# Patient Record
Sex: Female | Born: 1950 | Race: White | Hispanic: No | Marital: Married | Smoking: Never smoker
Health system: Southern US, Community
[De-identification: ages and names within clinical notes are randomized; demographics above are authoritative.]

## PROBLEM LIST (undated history)

## (undated) DIAGNOSIS — M25561 Pain in right knee: Secondary | ICD-10-CM

## (undated) DIAGNOSIS — N760 Acute vaginitis: Secondary | ICD-10-CM

## (undated) DIAGNOSIS — F32A Depression, unspecified: Secondary | ICD-10-CM

## (undated) DIAGNOSIS — I6529 Occlusion and stenosis of unspecified carotid artery: Secondary | ICD-10-CM

## (undated) DIAGNOSIS — E7800 Pure hypercholesterolemia, unspecified: Secondary | ICD-10-CM

## (undated) DIAGNOSIS — Z79899 Other long term (current) drug therapy: Secondary | ICD-10-CM

## (undated) DIAGNOSIS — K219 Gastro-esophageal reflux disease without esophagitis: Secondary | ICD-10-CM

## (undated) DIAGNOSIS — I7789 Other specified disorders of arteries and arterioles: Secondary | ICD-10-CM

## (undated) DIAGNOSIS — L0390 Cellulitis, unspecified: Secondary | ICD-10-CM

## (undated) DIAGNOSIS — A048 Other specified bacterial intestinal infections: Secondary | ICD-10-CM

## (undated) DIAGNOSIS — M8580 Other specified disorders of bone density and structure, unspecified site: Secondary | ICD-10-CM

## (undated) DIAGNOSIS — M810 Age-related osteoporosis without current pathological fracture: Secondary | ICD-10-CM

## (undated) DIAGNOSIS — G8929 Other chronic pain: Secondary | ICD-10-CM

## (undated) DIAGNOSIS — L309 Dermatitis, unspecified: Secondary | ICD-10-CM

## (undated) DIAGNOSIS — E049 Nontoxic goiter, unspecified: Secondary | ICD-10-CM

## (undated) DIAGNOSIS — K2270 Barrett's esophagus without dysplasia: Secondary | ICD-10-CM

## (undated) DIAGNOSIS — R519 Headache, unspecified: Secondary | ICD-10-CM

## (undated) DIAGNOSIS — D649 Anemia, unspecified: Secondary | ICD-10-CM

## (undated) DIAGNOSIS — J301 Allergic rhinitis due to pollen: Secondary | ICD-10-CM

## (undated) DIAGNOSIS — G629 Polyneuropathy, unspecified: Secondary | ICD-10-CM

## (undated) DIAGNOSIS — F419 Anxiety disorder, unspecified: Secondary | ICD-10-CM

## (undated) DIAGNOSIS — R0989 Other specified symptoms and signs involving the circulatory and respiratory systems: Secondary | ICD-10-CM

## (undated) DIAGNOSIS — F411 Generalized anxiety disorder: Secondary | ICD-10-CM

## (undated) DIAGNOSIS — M51369 Other intervertebral disc degeneration, lumbar region without mention of lumbar back pain or lower extremity pain: Secondary | ICD-10-CM

## (undated) DIAGNOSIS — K229 Disease of esophagus, unspecified: Secondary | ICD-10-CM

## (undated) DIAGNOSIS — E559 Vitamin D deficiency, unspecified: Secondary | ICD-10-CM

## (undated) DIAGNOSIS — G894 Chronic pain syndrome: Secondary | ICD-10-CM

## (undated) DIAGNOSIS — E782 Mixed hyperlipidemia: Secondary | ICD-10-CM

## (undated) DIAGNOSIS — K250 Acute gastric ulcer with hemorrhage: Secondary | ICD-10-CM

## (undated) DIAGNOSIS — M5090 Cervical disc disorder, unspecified, unspecified cervical region: Secondary | ICD-10-CM

## (undated) DIAGNOSIS — K200 Eosinophilic esophagitis: Secondary | ICD-10-CM

## (undated) DIAGNOSIS — Q398 Other congenital malformations of esophagus: Secondary | ICD-10-CM

## (undated) DIAGNOSIS — K5281 Eosinophilic gastritis or gastroenteritis: Secondary | ICD-10-CM

## (undated) DIAGNOSIS — E785 Hyperlipidemia, unspecified: Secondary | ICD-10-CM

## (undated) DIAGNOSIS — R0609 Other forms of dyspnea: Secondary | ICD-10-CM

## (undated) HISTORY — DX: Allergic rhinitis due to pollen: J30.1

## (undated) HISTORY — DX: Dermatitis, unspecified: L30.9

## (undated) HISTORY — DX: Other specified symptoms and signs involving the circulatory and respiratory systems: R09.89

## (undated) HISTORY — DX: Mixed hyperlipidemia: E78.2

## (undated) HISTORY — DX: Other intervertebral disc degeneration, lumbar region without mention of lumbar back pain or lower extremity pain: M51.369

## (undated) HISTORY — DX: Other chronic pain: G89.29

## (undated) HISTORY — DX: Pure hypercholesterolemia, unspecified: E78.00

## (undated) HISTORY — DX: Cervical disc disorder, unspecified, unspecified cervical region: M50.90

## (undated) HISTORY — DX: Eosinophilic esophagitis: K20.0

## (undated) HISTORY — DX: Occlusion and stenosis of unspecified carotid artery: I65.29

## (undated) HISTORY — DX: Barrett's esophagus without dysplasia: K22.70

## (undated) HISTORY — DX: Other forms of dyspnea: R06.09

## (undated) HISTORY — DX: Age-related osteoporosis without current pathological fracture: M81.0

## (undated) HISTORY — DX: Disease of esophagus, unspecified: K22.9

## (undated) HISTORY — DX: Acute vaginitis: N76.0

## (undated) HISTORY — DX: Other long term (current) drug therapy: Z79.899

## (undated) HISTORY — DX: Chronic pain syndrome: G89.4

## (undated) HISTORY — DX: Polyneuropathy, unspecified: G62.9

## (undated) HISTORY — DX: Generalized anxiety disorder: F41.1

## (undated) HISTORY — DX: Other specified disorders of bone density and structure, unspecified site: M85.80

## (undated) HISTORY — DX: Headache, unspecified: R51.9

## (undated) HISTORY — DX: Cellulitis, unspecified: L03.90

## (undated) HISTORY — DX: Anxiety disorder, unspecified: F41.9

## (undated) HISTORY — DX: Other specified disorders of arteries and arterioles: I77.89

## (undated) HISTORY — DX: Other congenital malformations of esophagus: Q39.8

## (undated) HISTORY — DX: Hyperlipidemia, unspecified: E78.5

## (undated) HISTORY — DX: Nontoxic goiter, unspecified: E04.9

## (undated) HISTORY — DX: Depression, unspecified: F32.A

## (undated) HISTORY — DX: Vitamin D deficiency, unspecified: E55.9

## (undated) HISTORY — DX: Acute gastric ulcer with hemorrhage: K25.0

## (undated) HISTORY — DX: Gastro-esophageal reflux disease without esophagitis: K21.9

## (undated) HISTORY — DX: Eosinophilic gastritis or gastroenteritis: K52.81

## (undated) HISTORY — DX: Pain in right knee: M25.561

## (undated) HISTORY — DX: Other specified bacterial intestinal infections: A04.8

## (undated) HISTORY — DX: Anemia, unspecified: D64.9

---

## 2011-05-14 DIAGNOSIS — H269 Unspecified cataract: Secondary | ICD-10-CM | POA: Insufficient documentation

## 2011-05-14 DIAGNOSIS — H04559 Acquired stenosis of unspecified nasolacrimal duct: Secondary | ICD-10-CM | POA: Insufficient documentation

## 2011-06-26 DIAGNOSIS — G43909 Migraine, unspecified, not intractable, without status migrainosus: Secondary | ICD-10-CM | POA: Insufficient documentation

## 2015-03-16 DIAGNOSIS — M47816 Spondylosis without myelopathy or radiculopathy, lumbar region: Secondary | ICD-10-CM | POA: Insufficient documentation

## 2015-03-16 DIAGNOSIS — M5136 Other intervertebral disc degeneration, lumbar region: Secondary | ICD-10-CM | POA: Insufficient documentation

## 2016-08-28 DIAGNOSIS — M5416 Radiculopathy, lumbar region: Secondary | ICD-10-CM | POA: Insufficient documentation

## 2016-08-30 ENCOUNTER — Other Ambulatory Visit (HOSPITAL_BASED_OUTPATIENT_CLINIC_OR_DEPARTMENT_OTHER): Payer: Self-pay | Admitting: Neurosurgery

## 2016-08-30 DIAGNOSIS — M5440 Lumbago with sciatica, unspecified side: Secondary | ICD-10-CM

## 2016-09-01 ENCOUNTER — Ambulatory Visit (HOSPITAL_BASED_OUTPATIENT_CLINIC_OR_DEPARTMENT_OTHER)
Admission: RE | Admit: 2016-09-01 | Discharge: 2016-09-01 | Disposition: A | Discharge status: Home / Self Care | Payer: Medicare Other | Source: Ambulatory Visit | Attending: Neurosurgery | Admitting: Neurosurgery

## 2016-09-01 DIAGNOSIS — M5136 Other intervertebral disc degeneration, lumbar region: Secondary | ICD-10-CM | POA: Diagnosis not present

## 2016-09-01 DIAGNOSIS — M5126 Other intervertebral disc displacement, lumbar region: Secondary | ICD-10-CM | POA: Insufficient documentation

## 2016-09-01 DIAGNOSIS — M5440 Lumbago with sciatica, unspecified side: Secondary | ICD-10-CM | POA: Insufficient documentation

## 2016-09-01 IMAGING — MR MR LUMBAR SPINE W/O CM
4 of 5 series · 26 of 48 positions shown · non-contrast
Comparison: MRI lumbar spine 12/02/2014.

CLINICAL DATA: Low back pain and right leg pain and tingling for 2
weeks. No known injury.

EXAM:
MRI LUMBAR SPINE WITHOUT CONTRAST
TECHNIQUE: Multiplanar, multisequence MR imaging of the lumbar spine was
performed. No intravenous contrast was administered.

[Series 2: T1 · sagittal · 4.0mm · 0.51mm/px · 6 of 14 slices shown (1 of 2)]
[im 1/14]
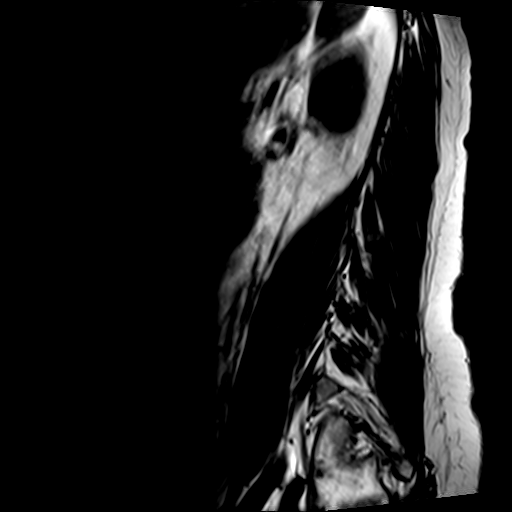
[im 3/14]
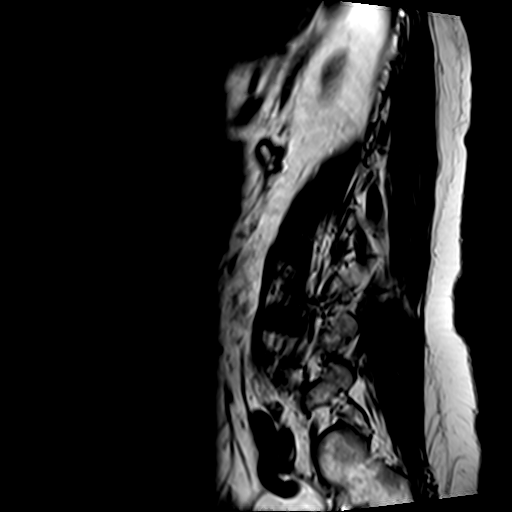
[im 6/14]
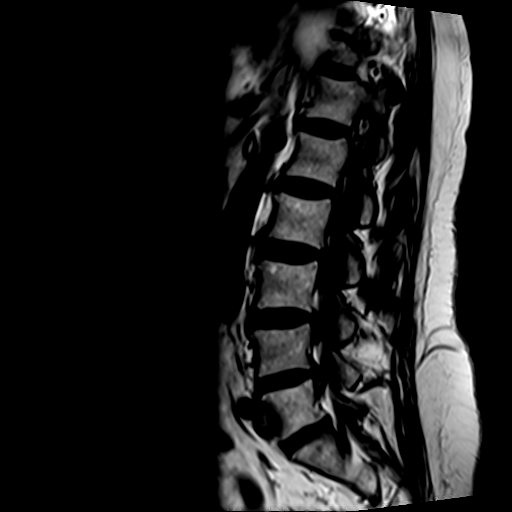
[im 8/14]
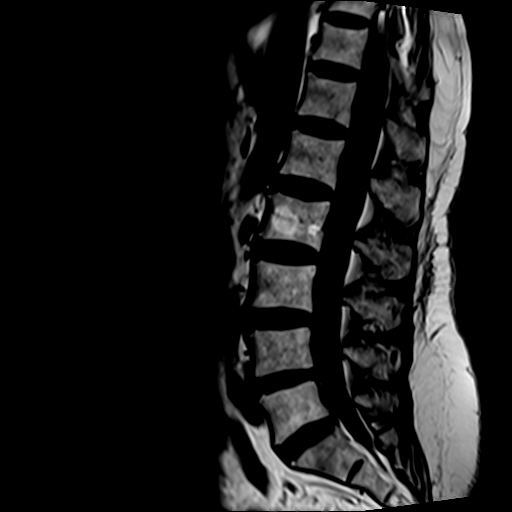
[im 11/14]
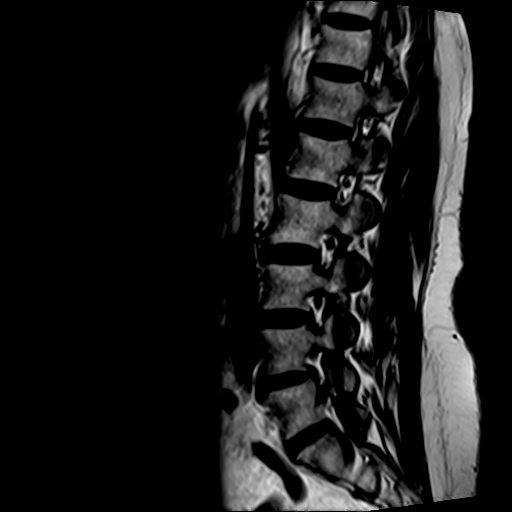
[im 14/14]
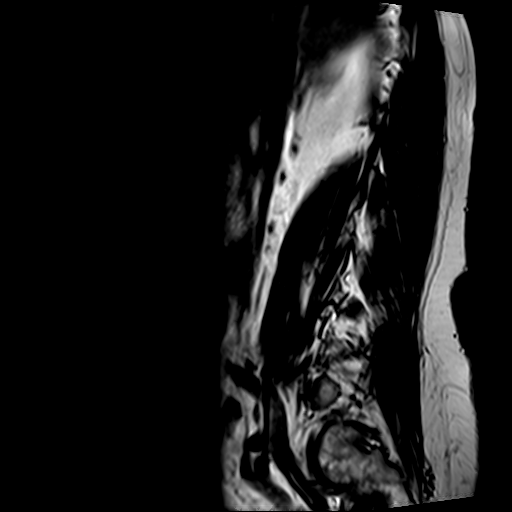

[Series 3: T2 · sagittal · 4.0mm · 0.81mm/px · 6 of 14 slices shown (1 of 2)]
[im 1/14]
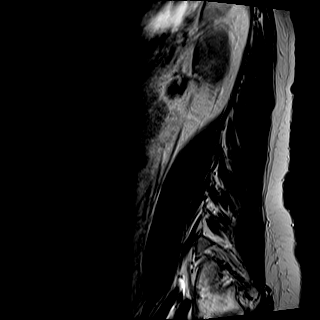
[im 3/14]
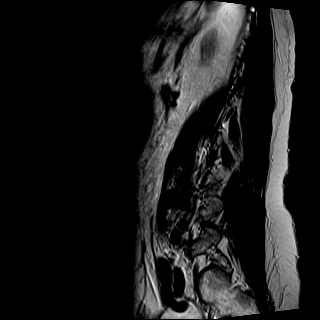
[im 6/14]
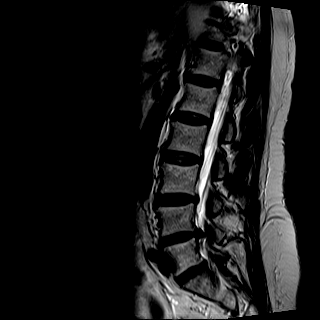
[im 8/14]
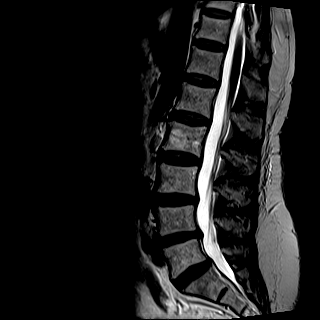
[im 11/14]
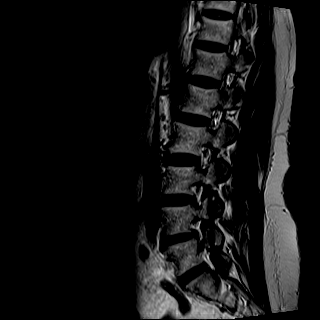
[im 14/14]
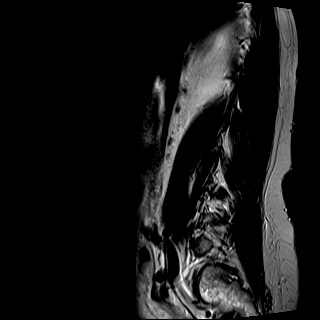

[Series 5: T2 · axial · 4.0mm · 0.39mm/px · z∈[-85,+104]mm · 9 of 34 slices shown (2 of 2)]
[im 1/34]
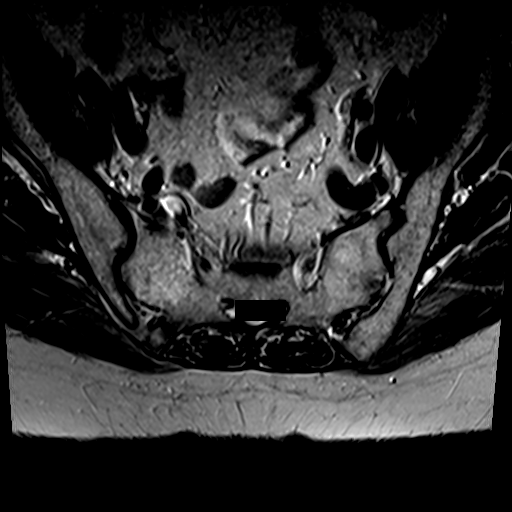
[im 5/34]
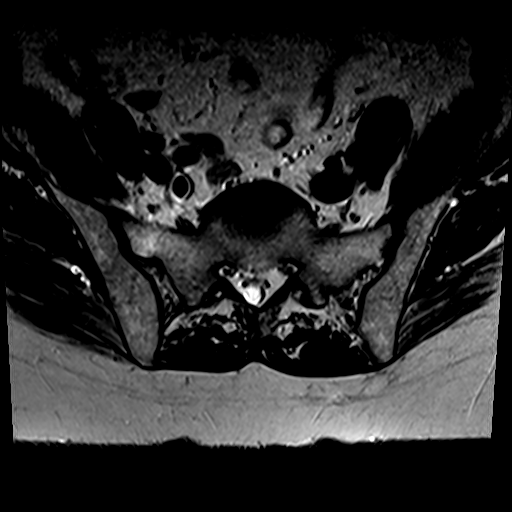
[im 10/34]
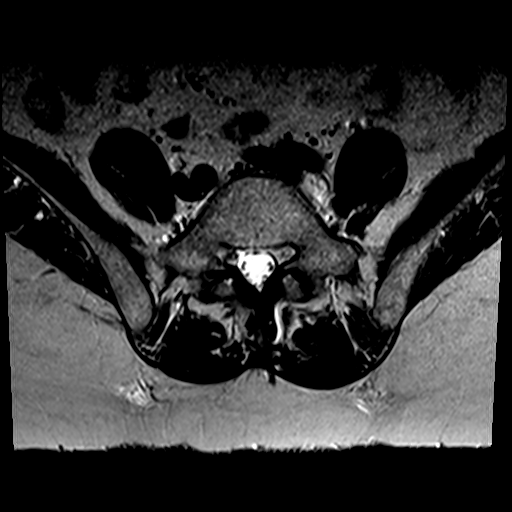
[im 15/34]
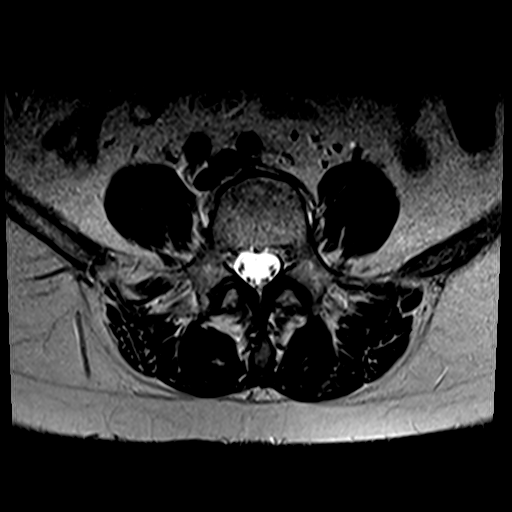
[im 17/34]
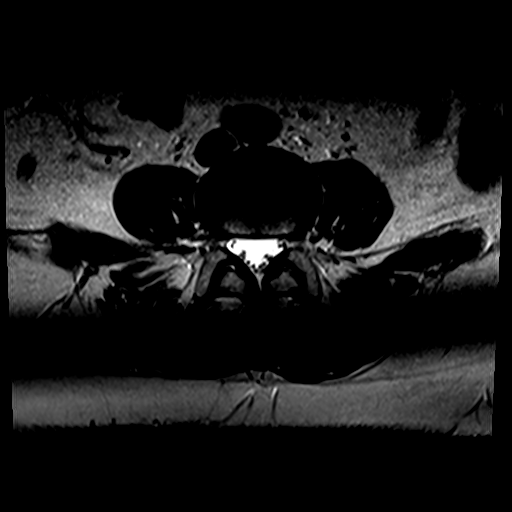
[im 19/34]
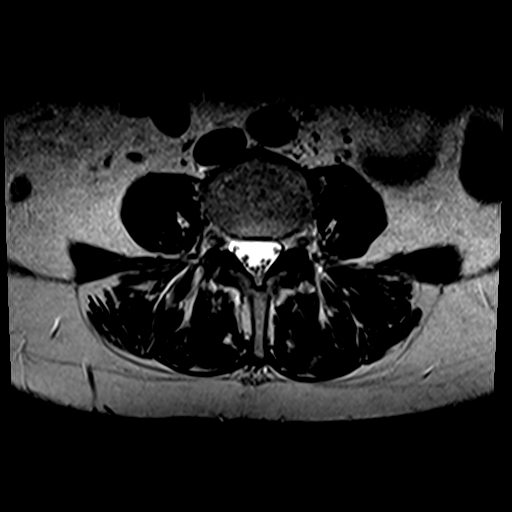
[im 24/34]
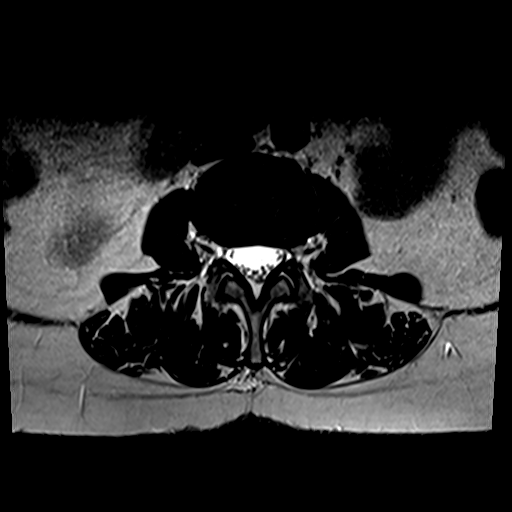
[im 29/34]
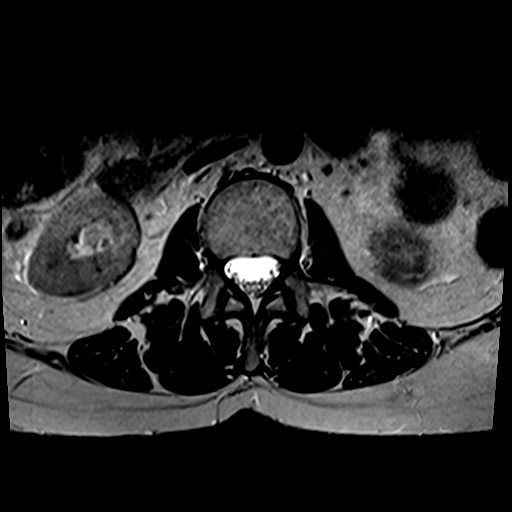
[im 34/34]
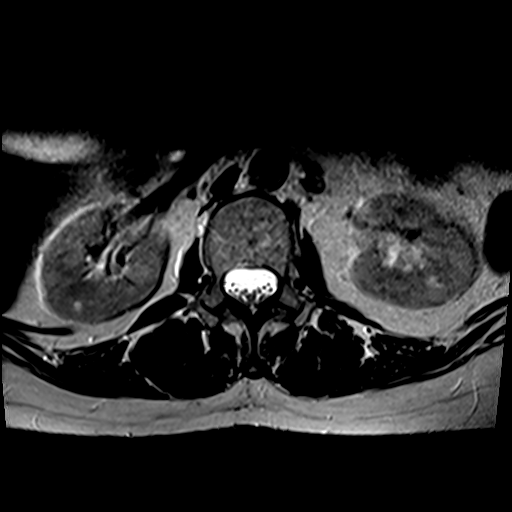

[Series 6: T1 · axial · 4.0mm · 0.78mm/px · z∈[-85,+79]mm · 5 of 34 slices shown (2 of 2)]
[im 1/34]
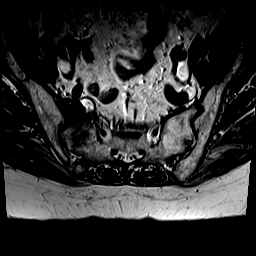
[im 5/34]
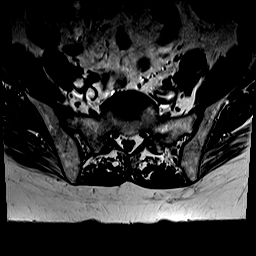
[im 10/34]
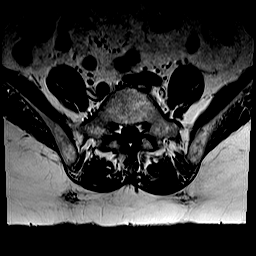
[im 17/34]
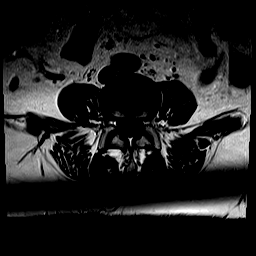
[im 29/34]
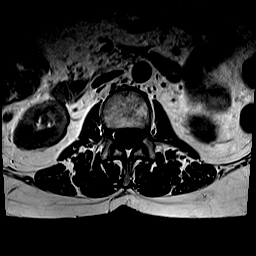

[26 of 48 positions shown; findings below may reference images not displayed]

FINDINGS: Segmentation:  Standard.

Alignment:  Normal.

Vertebrae: Marrow edema is seen in the right sacrum consistent with
a sacral insufficiency fracture. Vertebral body height is
maintained. Hemangioma in L2 is unchanged.

Conus medullaris: Extends to the L1 level and appears normal.

Paraspinal and other soft tissues: Negative.

Disc levels:

T10-11, T11-12 and T12-L1 are imaged in the sagittal plane only and
negative.

L1-2:  Negative.

L2-3: Minimal anterior endplate spur, unchanged. No disc herniation.
The central canal and foramina are widely patent.

L3-4: Minimal disc bulge without central canal or foraminal
stenosis.

L4-5: There has been some progression of degenerative disease at
this level. The patient has a disc bulge eccentric to the left and a
left subarticular recess protrusion which has increased in size and
impinges on the descending left L5 root. The foramina are open.

L5-S1: Minimal disc bulge without central canal or foraminal
narrowing.
IMPRESSION: Marrow edema in the right sacrum consistent with an insufficiency
fracture given no history of trauma.

Progressive degenerative disc disease at L4-5 where there is a
protrusion in the left subarticular recess impinging on the
descending left L5 root.

## 2016-09-05 ENCOUNTER — Other Ambulatory Visit: Payer: Self-pay | Admitting: Neurosurgery

## 2016-09-05 DIAGNOSIS — M5126 Other intervertebral disc displacement, lumbar region: Secondary | ICD-10-CM

## 2016-09-11 ENCOUNTER — Ambulatory Visit
Admission: RE | Admit: 2016-09-11 | Discharge: 2016-09-11 | Disposition: A | Discharge status: Home / Self Care | Payer: Medicare Other | Source: Ambulatory Visit | Attending: Neurosurgery | Admitting: Neurosurgery

## 2016-09-11 VITALS — BP 117/69 | HR 104

## 2016-09-11 DIAGNOSIS — M5416 Radiculopathy, lumbar region: Secondary | ICD-10-CM

## 2016-09-11 DIAGNOSIS — M5126 Other intervertebral disc displacement, lumbar region: Secondary | ICD-10-CM

## 2016-09-11 IMAGING — XA DG EPIDURAL/NERVE ROOT
2 series · 2 of 2 positions shown · non-contrast
Comparison: none

CLINICAL DATA: Lumbosacral spondylosis without myelopathy with
radiculopathy. Right buttock and right lower extremity pain. MRI
demonstrates L4-5 disc degeneration with left greater than right
lateral recess narrowing. Specific request for a transforaminal
injection on the right at L4-5.

[Series 1: ortho standard · 1 of 1 slices shown (1 of 2)]
[im 1/1]
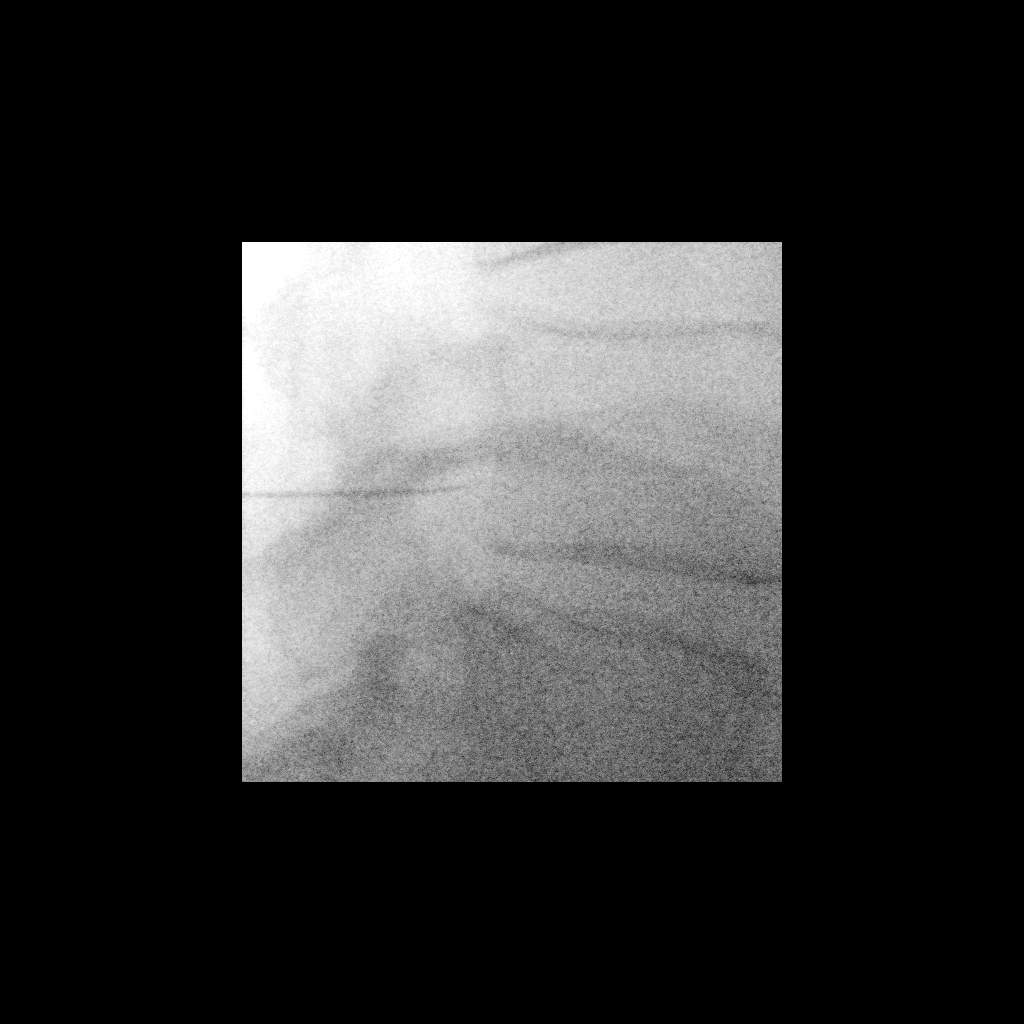

[Series 2: ortho standard · 1 of 1 slices shown (2 of 2)]
[im 1/1]
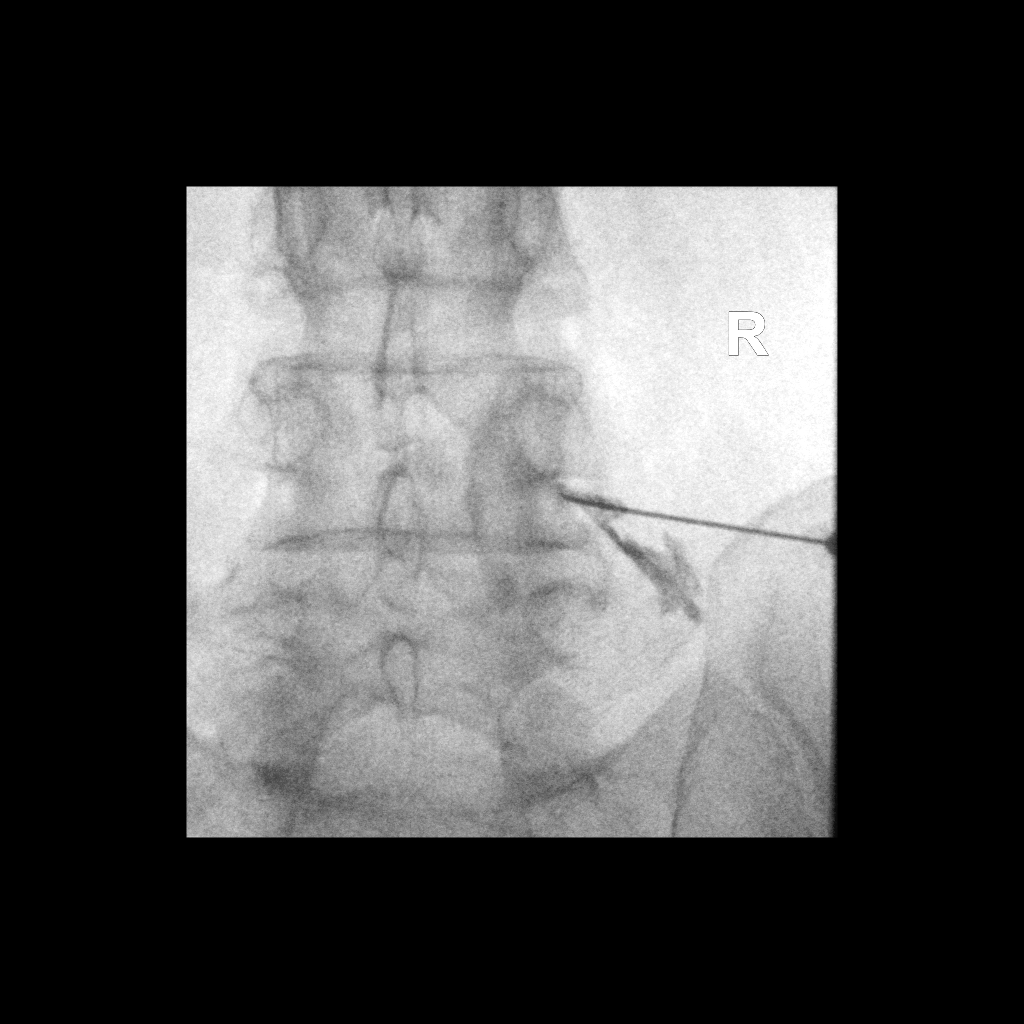

[2 of 2 positions shown; findings below may reference images not displayed]

EXAM:
EPIDURAL/NERVE ROOT

FLUOROSCOPY TIME:  Radiation Exposure Index (as provided by the
fluoroscopic device): 18.60 microGray*m^2

Fluoroscopy Time (in minutes and seconds):  14 seconds

PROCEDURE:
The procedure, risks, benefits, and alternatives were explained to
the patient. Questions regarding the procedure were encouraged and
answered. The patient understands and consents to the procedure.

RIGHT L4 NERVE ROOT BLOCK AND TRANSFORAMINAL EPIDURAL: A posterior
oblique approach was taken to the intervertebral foramen on the
right at L4-5 using a curved 3.5 inch 22 gauge spinal needle.
Injection of Isovue-M 200 outlined the right L4 nerve root and
showed good epidural spread. No vascular opacification is seen. 120
mg of Depo-Medrol mixed with 2 mL of 1% lidocaine were instilled.
The procedure was well-tolerated, and the patient was discharged
thirty minutes following the injection in good condition.

COMPLICATIONS:
None
IMPRESSION: Technically successful injection consisting of a right L4 nerve root
block and transforaminal epidural.

## 2016-09-11 MED ORDER — METHYLPREDNISOLONE ACETATE 40 MG/ML INJ SUSP (RADIOLOG
120.0000 mg | Freq: Once | INTRAMUSCULAR | Status: AC
  Administered 2016-09-11: 120 mg via EPIDURAL

## 2016-09-11 MED ORDER — IOPAMIDOL (ISOVUE-M 200) INJECTION 41%
1.0000 mL | Freq: Once | INTRAMUSCULAR | Status: AC
  Administered 2016-09-11: 1 mL via EPIDURAL

## 2016-09-11 NOTE — Discharge Instructions (Signed)

## 2016-10-12 ENCOUNTER — Other Ambulatory Visit: Payer: Self-pay | Admitting: Student

## 2016-10-12 DIAGNOSIS — M5126 Other intervertebral disc displacement, lumbar region: Secondary | ICD-10-CM

## 2016-10-25 ENCOUNTER — Other Ambulatory Visit: Payer: Self-pay | Admitting: Student

## 2016-10-25 ENCOUNTER — Ambulatory Visit
Admission: RE | Admit: 2016-10-25 | Discharge: 2016-10-25 | Disposition: A | Discharge status: Home / Self Care | Payer: Medicare Other | Source: Ambulatory Visit | Attending: Student | Admitting: Student

## 2016-10-25 DIAGNOSIS — M5126 Other intervertebral disc displacement, lumbar region: Secondary | ICD-10-CM

## 2016-10-25 IMAGING — XA Imaging study
2 series · 2 of 2 positions shown · non-contrast
Comparison: none

CLINICAL DATA: Lumbosacral spondylosis without myelopathy. The
right L4-5 transforaminal injection in [DATE] has relieved the
majority of the patient's right buttock and right lower extremity
pain. Her major complaint now is low back pain, left greater than
right. A repeat transforaminal injection was requested, however
given that the remaining pain is predominantly in the left low back,
a left-sided interlaminar injection will be performed instead.

[Series 1: ortho adipose · 1 of 1 slices shown (1 of 2)]
[im 1/1]
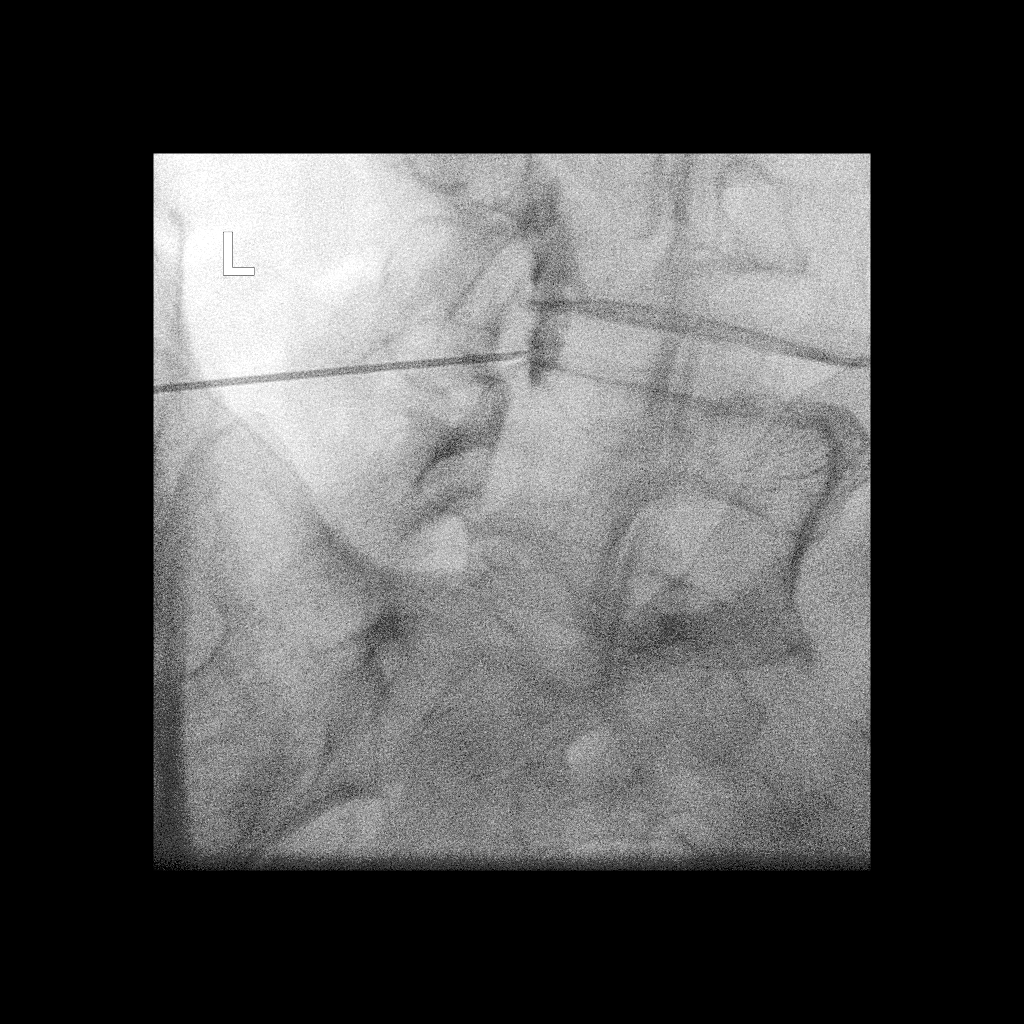

[Series 2: ortho adipose · 1 of 1 slices shown (2 of 2)]
[im 1/1]
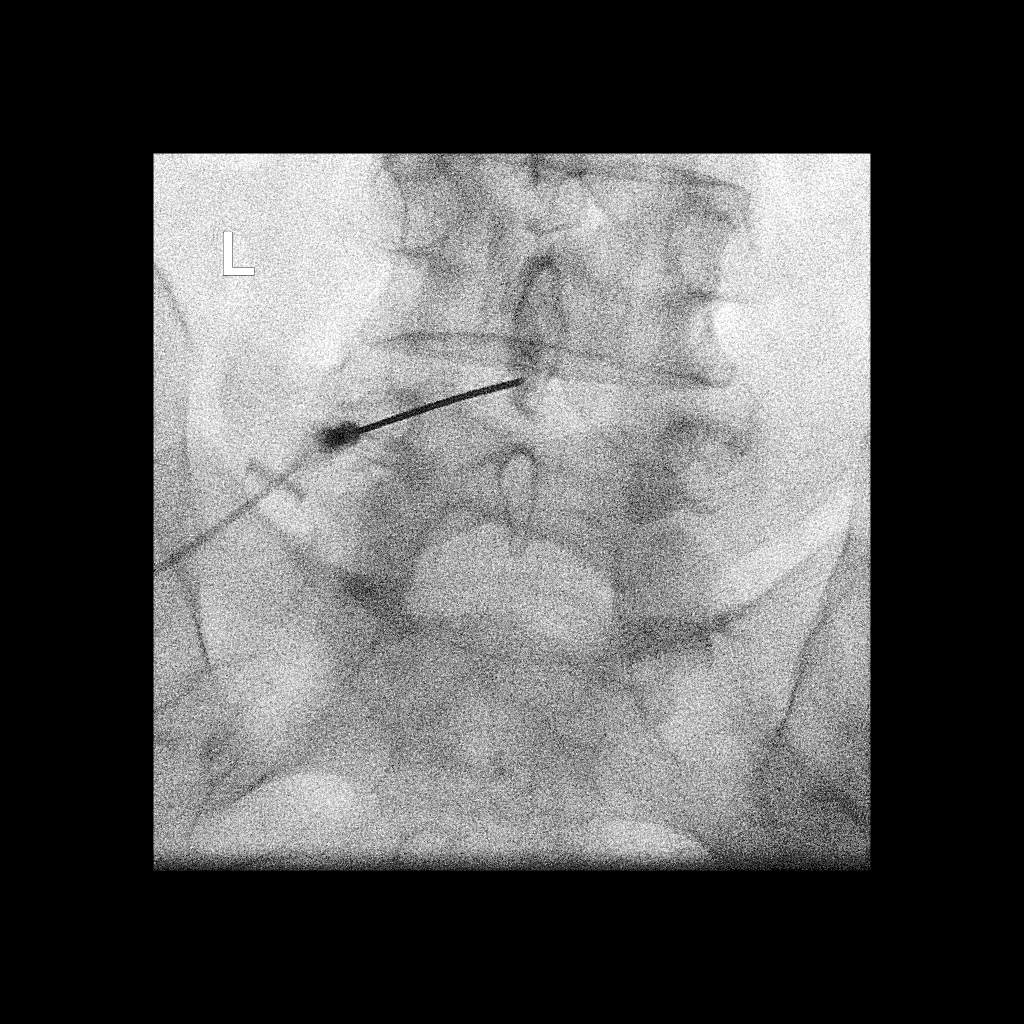

[2 of 2 positions shown; findings below may reference images not displayed]

FLUOROSCOPY TIME:  Radiation Exposure Index (as provided by the
fluoroscopic device): 16.14 microGray*m^2

Fluoroscopy Time (in minutes and seconds):  6 seconds

PROCEDURE:
The procedure, risks, benefits, and alternatives were explained to
the patient. Questions regarding the procedure were encouraged and
answered. The patient understands and consents to the procedure.

LUMBAR EPIDURAL INJECTION:

An interlaminar approach was performed on the left at L4-5. The
overlying skin was cleansed and anesthetized. A 3.5 inch 20 gauge
epidural needle was advanced using loss-of-resistance technique.

DIAGNOSTIC EPIDURAL INJECTION:

Injection of Isovue-M 200 shows a good epidural pattern with spread
above and below the level of needle placement, primarily on the
left. No vascular opacification is seen.

THERAPEUTIC EPIDURAL INJECTION:

120 mg of Depo-Medrol mixed with 3 mL of 1% lidocaine were
instilled. The procedure was well-tolerated, and the patient was
discharged thirty minutes following the injection in good condition.

COMPLICATIONS:
None
IMPRESSION: Technically successful lumbar interlaminar epidural injection on the
left at L4-5.

## 2016-10-25 MED ORDER — METHYLPREDNISOLONE ACETATE 40 MG/ML INJ SUSP (RADIOLOG
120.0000 mg | Freq: Once | INTRAMUSCULAR | Status: AC
  Administered 2016-10-25: 120 mg via EPIDURAL

## 2016-10-25 MED ORDER — IOPAMIDOL (ISOVUE-M 200) INJECTION 41%
1.0000 mL | Freq: Once | INTRAMUSCULAR | Status: AC
  Administered 2016-10-25: 1 mL via EPIDURAL

## 2020-08-12 ENCOUNTER — Other Ambulatory Visit: Payer: Self-pay | Admitting: Neurosurgery

## 2020-08-12 DIAGNOSIS — M5440 Lumbago with sciatica, unspecified side: Secondary | ICD-10-CM

## 2020-08-24 ENCOUNTER — Ambulatory Visit
Admission: RE | Admit: 2020-08-24 | Discharge: 2020-08-24 | Disposition: A | Discharge status: Home / Self Care | Payer: Medicare Other | Source: Ambulatory Visit | Attending: Neurosurgery | Admitting: Neurosurgery

## 2020-08-24 DIAGNOSIS — M5440 Lumbago with sciatica, unspecified side: Secondary | ICD-10-CM

## 2020-08-24 IMAGING — XA DG EPIDURAL NERVE ROOT
2 series · 2 of 2 positions shown · non-contrast
Comparison: none

CLINICAL DATA: Lumbosacral spondylosis without myelopathy. Right
lower extremity numbness. Right L4 nerve root block requested.

[Series 1: ortho adipose · 1 of 1 slices shown (1 of 2)]
[im 1/1]
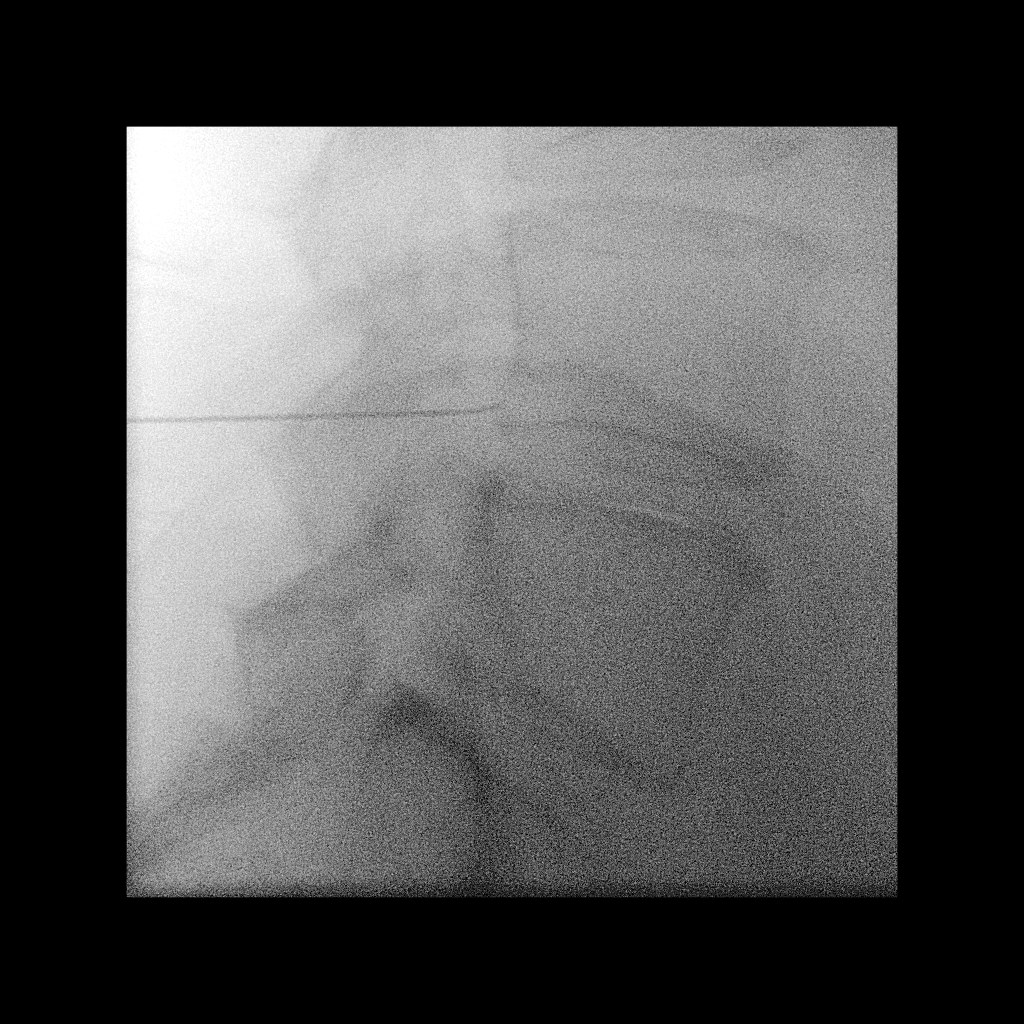

[Series 2: ortho adipose · 1 of 1 slices shown (2 of 2)]
[im 1/1]
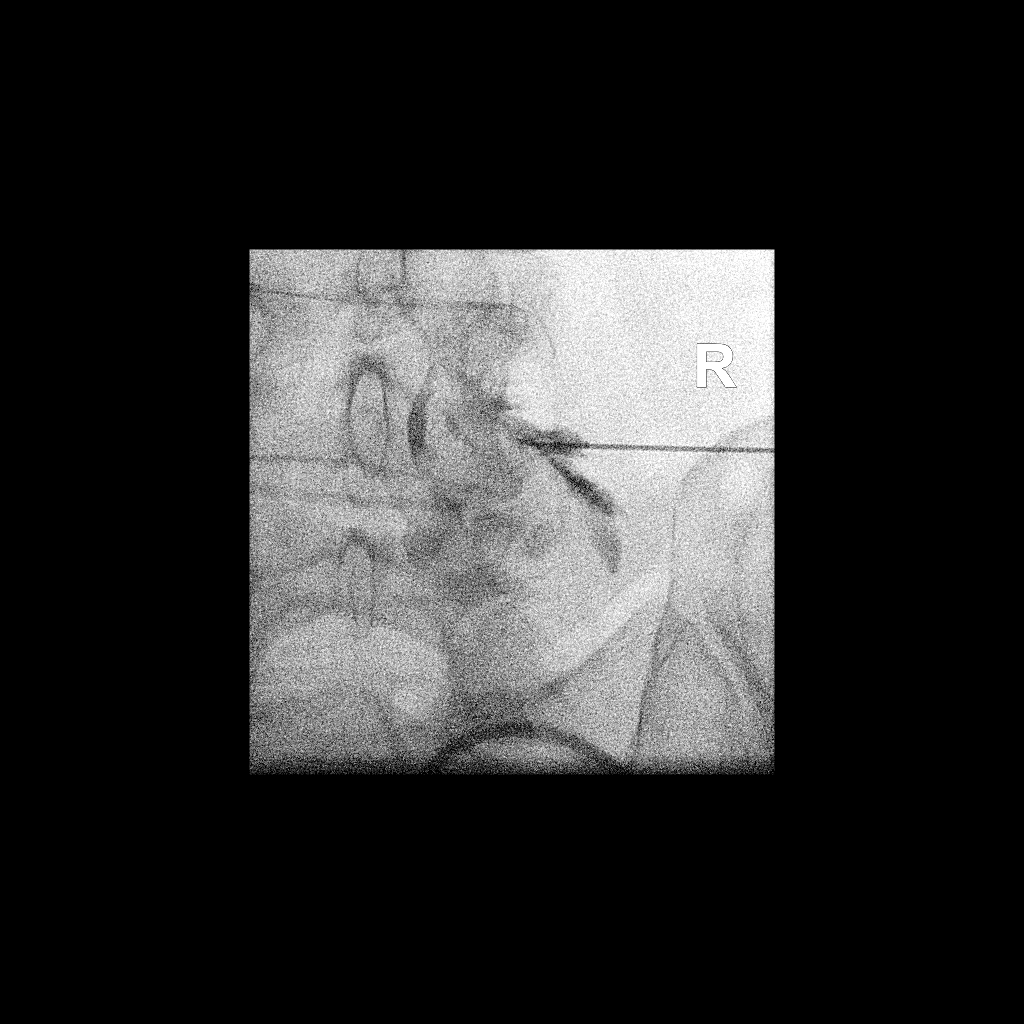

[2 of 2 positions shown; findings below may reference images not displayed]

EXAM:
EPIDURAL/NERVE ROOT

FLUOROSCOPY TIME:  Fluoroscopy Time: 18 seconds

Radiation Exposure Index: 29.84 microGray*m^2

PROCEDURE:
The procedure, risks, benefits, and alternatives were explained to
the patient. Questions regarding the procedure were encouraged and
answered. The patient understands and consents to the procedure.

RIGHT L4 NERVE ROOT BLOCK AND TRANSFORAMINAL EPIDURAL: A posterior
oblique approach was taken to the intervertebral foramen on the
right at L4-5 using a curved 5 inch 22 gauge spinal needle.
Injection of Isovue-M 200 outlined the right L4 nerve root and
showed good epidural spread. No vascular opacification is seen. 80
mg of Depo-Medrol mixed with 2 mL of 1% lidocaine were instilled.
The procedure was well-tolerated, and the patient was discharged
thirty minutes following the injection in good condition.

COMPLICATIONS:
None
IMPRESSION: Technically successful injection consisting of a right L4 nerve root
block and transforaminal epidural.

## 2020-08-24 MED ORDER — IOPAMIDOL (ISOVUE-M 200) INJECTION 41%
1.0000 mL | Freq: Once | INTRAMUSCULAR | Status: AC
  Administered 2020-08-24: 1 mL via EPIDURAL

## 2020-08-24 MED ORDER — METHYLPREDNISOLONE ACETATE 40 MG/ML INJ SUSP (RADIOLOG
80.0000 mg | Freq: Once | INTRAMUSCULAR | Status: AC
  Administered 2020-08-24: 80 mg via EPIDURAL

## 2020-08-24 NOTE — Discharge Instructions (Signed)
Post Procedure Spinal Discharge Instruction Sheet  1. You may resume a regular diet and any medications that you routinely take (including pain medications).  2. No driving day of procedure.  3. Light activity throughout the rest of the day.  Do not do any strenuous work, exercise, bending or lifting.  The day following the procedure, you can resume normal physical activity but you should refrain from exercising or physical therapy for at least three days thereafter.   Common Side Effects:   Headaches- take your usual medications as directed by your physician.  Increase your fluid intake.  Caffeinated beverages may be helpful.  Lie flat in bed until your headache resolves.   Restlessness or inability to sleep- you may have trouble sleeping for the next few days.  Ask your referring physician if you need any medication for sleep.   Facial flushing or redness- should subside within a few days.   Increased pain- a temporary increase in pain a day or two following your procedure is not unusual.  Take your pain medication as prescribed by your referring physician.   Leg cramps  Please contact our office at [phone] for the following symptoms:  Fever greater than 100 degrees.  Headaches unresolved with medication after 2-3 days.  Increased swelling, pain, or redness at injection site.   Thank you for visiting [facility] today.   YOU MAY RESUME YOUR ASPRIN ANYTIME AFTER INJECTION TODAY 08/24/20 

## 2020-11-03 DIAGNOSIS — R739 Hyperglycemia, unspecified: Secondary | ICD-10-CM

## 2020-11-03 HISTORY — DX: Hyperglycemia, unspecified: R73.9

## 2020-12-09 ENCOUNTER — Encounter: Payer: Self-pay | Admitting: Neurology

## 2021-02-20 ENCOUNTER — Encounter: Payer: Self-pay | Admitting: Neurology

## 2021-02-20 ENCOUNTER — Ambulatory Visit (INDEPENDENT_AMBULATORY_CARE_PROVIDER_SITE_OTHER): Payer: Medicare Other | Admitting: Neurology

## 2021-02-20 ENCOUNTER — Other Ambulatory Visit: Payer: Self-pay

## 2021-02-20 VITALS — BP 141/71 | HR 84 | Ht 65.0 in | Wt 156.0 lb

## 2021-02-20 DIAGNOSIS — M25561 Pain in right knee: Secondary | ICD-10-CM

## 2021-02-20 DIAGNOSIS — G8929 Other chronic pain: Secondary | ICD-10-CM | POA: Diagnosis not present

## 2021-02-20 DIAGNOSIS — R202 Paresthesia of skin: Secondary | ICD-10-CM | POA: Diagnosis not present

## 2021-02-20 DIAGNOSIS — G5711 Meralgia paresthetica, right lower limb: Secondary | ICD-10-CM

## 2021-02-20 NOTE — Patient Instructions (Addendum)

## 2021-02-20 NOTE — Progress Notes (Signed)
[facility] Neurology [facility] Clinic Note - Initial Visit   Date: 02/20/21  Kelli Jacobson MRN: [id number] DOB: Mar 11, 1951   Dear Roxanne Mins, PA-C:  Thank you for your kind referral of Kelli Jacobson for consultation of right thigh numbness. Although her history is well known to you, please allow [place] to reiterate it for the purpose of our medical record. The patient was accompanied to the clinic by self.    History of Present Illness: Kelli Jacobson is a 70 y.o. female with GERD, anxiety/depression, chronic pain, and hyperlipidemia presenting for evaluation of right thigh numbness.  Starting in April, she began having numbness involving the right lateral thigh towards her right knee. She initially saw Dr. Wynetta Emery where MRI lumbar spine shows mild disc protrusion in the lower lumbar segments, but nothing severe to explain her symptoms. Nevertheless, she was offered a trial of ESI to her back, which did not help.  She also has a right knee injection which has not helped.  She complains of dull achy pain involving the right knee, which is always worse by the end of the day.   Occasionally, she also has tingling in the left foot.   Past Medical History:  Diagnosis Date   Acute gastric ulcer with hemorrhage    Allergic rhinitis due to pollen    Anemia    Anxiety    Barrett's esophagus    determined by endoscopy   Bruit (arterial)    Carotid artery stenosis    Cellulitis    Cervical disc disease    Chronic ear pain    Chronic pain syndrome    DDD (degenerative disc disease), lumbar    Depression    Dermatitis    DOE (dyspnea on exertion)    Ectasia of artery (HCC)    Enlarged thyroid    Eosinophilic esophagitis    Eosinophilic gastritis    Gastroesophageal reflux disease    Generalized anxiety disorder    H. pylori infection    High risk medication use    Hypercholesterolemia    Hyperglycemia 11/03/2020   Hyperlipidemia    Inlet patch of esophagus    Mixed hyperlipidemia     Nodule of esophagus    Osteopenia    Osteopenia    Osteoporosis    Peripheral neuropathy    Recurrent headache    Right knee pain    Vaginitis    Vitamin D deficiency     Medications:  Outpatient Encounter Medications as of 02/20/2021  Medication Sig   Biotin 10 MG CAPS Take 1 capsule by mouth daily.   Cholecalciferol 25 MCG (1000 UT) tablet Take by mouth.   cyclobenzaprine (FLEXERIL) 10 MG tablet Take by mouth.   HYDROcodone-acetaminophen (NORCO/VICODIN) 5-325 MG tablet Take by mouth.   LORazepam (ATIVAN) 2 MG tablet Take 2 mg by mouth 3 (three) times daily as needed.   magnesium 30 MG tablet Take 30 mg by mouth 2 (two) times daily.   omeprazole (PRILOSEC) 40 MG capsule omeprazole 40 mg capsule,delayed release   Potassium (POTASSIMIN PO) Take by mouth.   promethazine (PHENERGAN) 25 MG tablet Take by mouth.   tobramycin (TOBREX) 0.3 % ophthalmic solution    vitamin E 1000 UNIT capsule Take by mouth.   Zinc 22.5 MG TABS Take 1 tablet by mouth daily.   [DISCONTINUED] gabapentin (NEURONTIN) 300 MG capsule  (Patient not taking: Reported on 02/20/2021)   [DISCONTINUED] LORazepam (ATIVAN) 0.5 MG tablet  (Patient not taking: Reported on 02/20/2021)   [DISCONTINUED] Naproxen  Sodium 220 MG CAPS Take by mouth. (Patient not taking: Reported on 02/20/2021)   [DISCONTINUED] oxazepam (SERAX) 15 MG capsule  (Patient not taking: Reported on 02/20/2021)   [DISCONTINUED] SUMAtriptan (IMITREX) 100 MG tablet  (Patient not taking: Reported on 02/20/2021)   [DISCONTINUED] traMADol (ULTRAM) 50 MG tablet  (Patient not taking: Reported on 02/20/2021)   No facility-administered encounter medications on file as of 02/20/2021.    Allergies:  Allergies  Allergen Reactions   Codeine Nausea Only and Rash   Opium Nausea Only    Family History: Family History  Problem Relation Age of Onset   Alzheimer's disease Mother    Emphysema Father     Social History: Social History   Tobacco Use   Smoking  status: Never   Smokeless tobacco: Never  Substance Use Topics   Alcohol use: Never   Drug use: Never   Social History   Social History Narrative   Not on file    Vital Signs:  BP (!) 141/71   Pulse 84   Ht 5\' 5"  (1.651 m)   Wt 156 lb (70.8 kg)   SpO2 98%   BMI 25.96 kg/m    Neurological Exam: MENTAL STATUS including orientation to time, place, person, recent and remote memory, attention span and concentration, language, and fund of knowledge is normal.  Speech is not dysarthric.  CRANIAL NERVES: II:  No visual field defects.   III-IV-VI: Pupils equal round and reactive to light.  Normal conjugate, extra-ocular eye movements in all directions of gaze.  No nystagmus.  No ptosis.   V:  Normal facial sensation.    VII:  Normal facial symmetry and movements.   VIII:  Normal hearing and vestibular function.   IX-X:  Normal palatal movement.   XI:  Normal shoulder shrug and head rotation.   XII:  Normal tongue strength and range of motion, no deviation or fasciculation.  MOTOR:  No atrophy, fasciculations or abnormal movements.  No pronator drift.   Upper Extremity:  Right  Left  Deltoid  5/5   5/5   Biceps  5/5   5/5   Triceps  5/5   5/5   Infraspinatus 5/5  5/5  Medial pectoralis 5/5  5/5  Wrist extensors  5/5   5/5   Wrist flexors  5/5   5/5   Finger extensors  5/5   5/5   Finger flexors  5/5   5/5   Dorsal interossei  5/5   5/5   Abductor pollicis  5/5   5/5   Tone (Ashworth scale)  0  0   Lower Extremity:  Right  Left  Hip flexors  5/5   5/5   Hip extensors  5/5   5/5   Adductor 5/5  5/5  Abductor 5/5  5/5  Knee flexors  5/5   5/5   Knee extensors  5/5   5/5   Dorsiflexors  5/5   5/5   Plantarflexors  5/5   5/5   Toe extensors  5/5   5/5   Toe flexors  5/5   5/5   Tone (Ashworth scale)  0  0   MSRs:  Right        Left                  brachioradialis 2+  2+  biceps 2+  2+  triceps 2+  2+  patellar 2+  2+  ankle jerk 2+  2+  Hoffman no  no  plantar  response down  down   SENSORY:  Reduced pin prick and temperature over the right anterolateral thigh. Sensation in the right lower legs and left leg is normal.  COORDINATION/GAIT: Normal finger-to- nose-finger.  Intact rapid alternating movements bilaterally.  Gait narrow based and stable. Tandem and stressed gait intact.    IMPRESSION: Right meralgia paresthetica manifesting with numbness Right knee pain Left foot paresthesias  PLAN/RECOMMENDATIONS:  NCS/EMG of the legs Unfortunately she is most bothered by numbness, for which there is no medication for and management is supportive Regarding her knee pain, I have asked her to follow-up with her PCP/[facility]  Further recommendations pending results.   Thank you for allowing me to participate in patient's care.  If I can answer any additional questions, I would be pleased to do so.    Sincerely,    Loyde [facility] K. Allena Katz, DO

## 2021-03-30 ENCOUNTER — Encounter: Payer: Medicare Other | Admitting: Neurology

## 2021-06-08 ENCOUNTER — Other Ambulatory Visit: Payer: Self-pay

## 2021-06-08 ENCOUNTER — Ambulatory Visit (INDEPENDENT_AMBULATORY_CARE_PROVIDER_SITE_OTHER): Payer: Medicare Other | Admitting: Neurology

## 2021-06-08 DIAGNOSIS — G5711 Meralgia paresthetica, right lower limb: Secondary | ICD-10-CM | POA: Diagnosis not present

## 2021-06-08 NOTE — Procedures (Signed)
[facility]  ?[address] ? [place], [place] [address] ?Tel: [phone] ?Fax:  [phone] ?Test Date:  06/08/2021 ? ?Patient: Kelli Jacobson DOB: 16-Dec-1950 Physician: Nita Sickle, DO  ?Sex: Female Height: 5\' 5"  Ref Phys: , DO  ?ID#: Nita Sickle   Technician:   ? ?Patient Complaints: ?This is a 71 year old female referred for evaluation of bilateral leg paresthesias. ? ?NCV & EMG Findings: ?Extensive electrodiagnostic testing of the right lower extremity and additional studies of the left shows: ?Bilateral sural and superficial peroneal sensory responses are within normal limits ?Bilateral peroneal and tibial motor responses are within normal limits. ?Bilateral tibial H reflex studies are within normal limits. ?Chronic motor axonal loss changes are seen affecting the left L5 myotome, without accompanied active denervation.  These findings are not present in the right lower extremity. ? 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 ?There is no evidence of a large fiber sensorimotor polyneuropathy affecting the lower extremities. ? ? ?___________________________ ?62, DO ? ? ? ?Nerve Conduction Studies ?Anti Sensory Summary Table ? ? Stim Site NR Peak (ms) Norm Peak (ms) P-T Amp (?V) Norm P-T Amp  ?Left Sup Peroneal Anti Sensory (Ant Lat Mall)  32?C  ?12 cm    2.3 <4.6 9.0 >3  ?Right Sup Peroneal Anti Sensory (Ant Lat Mall)  32?C  ?12 cm    2.5 <4.6 10.3 >3  ?Left Sural Anti Sensory (Lat Mall)  32?C  ?Calf    2.9 <4.6 11.1 >3  ?Right Sural Anti Sensory (Lat Mall)  32?C  ?Calf    3.1 <4.6 13.8 >3  ? ?Motor Summary Table ? ? Stim Site NR Onset (ms) Norm Onset (ms) O-P Amp (mV) Norm O-P Amp Site1 Site2 Delta-0 (ms) Dist (cm) Vel (m/s) Norm Vel (m/s)  ?Left Peroneal Motor (Ext Dig Brev)  32?C  ?Ankle    3.4 <6.0 4.8 >2.5 B Fib Ankle 7.3 40.0 55 >40  ?B Fib    10.7  4.1  Poplt B Fib 1.3 8.0 62 >40  ?Poplt    12.0  3.9         ?Right Peroneal  Motor (Ext Dig Brev)  32?C  ?Ankle    2.6 <6.0 4.4 >2.5 B Fib Ankle 7.6 41.0 54 >40  ?B Fib    10.2  4.1  Poplt B Fib 1.4 8.0 57 >40  ?Poplt    11.6  4.1         ?Left Tibial Motor (Abd Nita Sickle Brev)  32?C  ?Ankle    4.1 <6.0 10.7 >4 Knee Ankle 7.9 41.0 52 >40  ?Knee    12.0  9.9         ?Right Tibial Motor (Abd Margo Aye Brev)  32?C  ?Ankle    3.0 <6.0 13.3 >4 Knee Ankle 8.0 43.0 54 >40  ?Knee    11.0  9.8         ? ?H Reflex Studies ? ? NR H-Lat (ms) Lat Norm (ms) L-R H-Lat (ms)  ?Left Tibial (Gastroc)  32?C  ?   30.34 <35 0.82  ?Right Tibial (Gastroc)  32?C  ?   31.16 <35 0.82  ? ?EMG ? ? Side Muscle Ins Act Fibs Psw Fasc Number Recrt Dur Dur. Amp Amp. Poly Poly. Comment  ?Right AntTibialis Nml Nml Nml Nml Nml Nml Nml Nml Nml Nml Nml Nml N/A  ?Right Gastroc Nml Nml Nml Nml Nml Nml Nml Nml Nml Nml Nml Nml N/A  ?  Right Flex Dig Long Nml Nml Nml Nml Nml Nml Nml Nml Nml Nml Nml Nml N/A  ?Right RectFemoris Nml Nml Nml Nml Nml Nml Nml Nml Nml Nml Nml Nml N/A  ?Right GluteusMed Nml Nml Nml Nml Nml Nml Nml Nml Nml Nml Nml Nml N/A  ?Left AntTibialis Nml Nml Nml Nml 1- Rapid Some 1+ Some 1+ Some 1+ N/A  ?Left Gastroc Nml Nml Nml Nml Nml Nml Nml Nml Nml Nml Nml Nml N/A  ?Left Flex Dig Long Nml Nml Nml Nml 1- Rapid Some 1+ Some 1+ Some 1+ N/A  ?Left RectFemoris Nml Nml Nml Nml Nml Nml Nml Nml Nml Nml Nml Nml N/A  ?Left GluteusMed Nml Nml Nml Nml 1- Rapid Some 1+ Some 1+ Some 1+ N/A  ? ? ? ? ?Waveforms: ?    ? ?    ? ?    ? ?  ? ? ?

## 2024-04-09 ENCOUNTER — Other Ambulatory Visit: Payer: Self-pay

## 2024-04-09 ENCOUNTER — Emergency Department (HOSPITAL_BASED_OUTPATIENT_CLINIC_OR_DEPARTMENT_OTHER)

## 2024-04-09 ENCOUNTER — Emergency Department (HOSPITAL_BASED_OUTPATIENT_CLINIC_OR_DEPARTMENT_OTHER): Admission: EM | Admit: 2024-04-09 | Discharge: 2024-04-10 | Discharge status: Against Medical Advice

## 2024-04-09 ENCOUNTER — Encounter (HOSPITAL_BASED_OUTPATIENT_CLINIC_OR_DEPARTMENT_OTHER): Payer: Self-pay | Admitting: Emergency Medicine

## 2024-04-09 DIAGNOSIS — J40 Bronchitis, not specified as acute or chronic: Secondary | ICD-10-CM | POA: Diagnosis not present

## 2024-04-09 DIAGNOSIS — K529 Noninfective gastroenteritis and colitis, unspecified: Secondary | ICD-10-CM

## 2024-04-09 DIAGNOSIS — R000 Tachycardia, unspecified: Secondary | ICD-10-CM | POA: Insufficient documentation

## 2024-04-09 DIAGNOSIS — R059 Cough, unspecified: Secondary | ICD-10-CM | POA: Diagnosis present

## 2024-04-09 DIAGNOSIS — R062 Wheezing: Secondary | ICD-10-CM | POA: Insufficient documentation

## 2024-04-09 DIAGNOSIS — I2510 Atherosclerotic heart disease of native coronary artery without angina pectoris: Secondary | ICD-10-CM | POA: Insufficient documentation

## 2024-04-09 LAB — RESP PANEL BY RT-PCR (RSV, FLU A&B, COVID)  RVPGX2
Influenza A by PCR: NEGATIVE
Influenza B by PCR: NEGATIVE
Resp Syncytial Virus by PCR: NEGATIVE
SARS Coronavirus 2 by RT PCR: NEGATIVE

## 2024-04-09 LAB — COMPREHENSIVE METABOLIC PANEL WITH GFR
ALT: 20 U/L (ref 0–44)
AST: 23 U/L (ref 15–41)
Albumin: 4.5 g/dL (ref 3.5–5.0)
Alkaline Phosphatase: 84 U/L (ref 38–126)
Anion gap: 17 — ABNORMAL HIGH (ref 5–15)
BUN: 31 mg/dL — ABNORMAL HIGH (ref 8–23)
CO2: 22 mmol/L (ref 22–32)
Calcium: 9.8 mg/dL (ref 8.9–10.3)
Chloride: 101 mmol/L (ref 98–111)
Creatinine, Ser: 1.3 mg/dL — ABNORMAL HIGH (ref 0.44–1.00)
GFR, Estimated: 43 mL/min — ABNORMAL LOW
Glucose, Bld: 113 mg/dL — ABNORMAL HIGH (ref 70–99)
Potassium: 3.6 mmol/L (ref 3.5–5.1)
Sodium: 139 mmol/L (ref 135–145)
Total Bilirubin: 0.7 mg/dL (ref 0.0–1.2)
Total Protein: 7.6 g/dL (ref 6.5–8.1)

## 2024-04-09 LAB — CBC
HCT: 43.7 % (ref 36.0–46.0)
Hemoglobin: 14.7 g/dL (ref 12.0–15.0)
MCH: 32.2 pg (ref 26.0–34.0)
MCHC: 33.6 g/dL (ref 30.0–36.0)
MCV: 95.8 fL (ref 80.0–100.0)
Platelets: 272 K/uL (ref 150–400)
RBC: 4.56 MIL/uL (ref 3.87–5.11)
RDW: 11.9 % (ref 11.5–15.5)
WBC: 8.7 K/uL (ref 4.0–10.5)
nRBC: 0 % (ref 0.0–0.2)

## 2024-04-09 MED ORDER — ALBUTEROL SULFATE HFA 108 (90 BASE) MCG/ACT IN AERS: 2.0000 | INHALATION_SPRAY | RESPIRATORY_TRACT | 0 refills | Status: AC | PRN

## 2024-04-09 MED ORDER — IPRATROPIUM-ALBUTEROL 0.5-2.5 (3) MG/3ML IN SOLN
3.0000 mL | Freq: Once | RESPIRATORY_TRACT | Status: AC
  Administered 2024-04-09: 3 mL via RESPIRATORY_TRACT
  Filled 2024-04-09: qty 3

## 2024-04-09 MED ORDER — PREDNISONE 10 MG (21) PO TBPK: ORAL_TABLET | Freq: Every day | ORAL | 0 refills | Status: AC

## 2024-04-09 MED ORDER — ONDANSETRON HCL 4 MG/2ML IJ SOLN
4.0000 mg | Freq: Once | INTRAMUSCULAR | Status: AC
  Administered 2024-04-09: 4 mg via INTRAVENOUS
  Filled 2024-04-09: qty 2

## 2024-04-09 MED ORDER — ALBUTEROL SULFATE (2.5 MG/3ML) 0.083% IN NEBU
2.5000 mg | INHALATION_SOLUTION | Freq: Once | RESPIRATORY_TRACT | Status: AC
  Administered 2024-04-09: 2.5 mg via RESPIRATORY_TRACT
  Filled 2024-04-09: qty 3

## 2024-04-09 MED ORDER — IOHEXOL 350 MG/ML SOLN
60.0000 mL | Freq: Once | INTRAVENOUS | Status: AC | PRN
  Administered 2024-04-09: 60 mL via INTRAVENOUS

## 2024-04-09 MED ORDER — SODIUM CHLORIDE 0.9 % IV BOLUS
1000.0000 mL | Freq: Once | INTRAVENOUS | Status: AC
  Administered 2024-04-09: 1000 mL via INTRAVENOUS

## 2024-04-09 MED ORDER — ONDANSETRON 4 MG PO TBDP: 4.0000 mg | ORAL_TABLET | Freq: Three times a day (TID) | ORAL | 0 refills | Status: AC | PRN

## 2024-04-09 NOTE — ED Provider Notes (Signed)
 " [place] [facility] Provider Note   CSN: [id number] Arrival date & time: 04/09/24  2048     Patient presents with: Cough, Diarrhea, and Emesis   Kelli Jacobson is a 74 y.o. female.   This is a 74 year old female presenting emergency department with generalized malaise, shortness of breath, nausea vomiting diarrhea.  Having a productive cough.  Having symptoms for the past 4 to 5 days.  Reports fever this morning.  Having some shortness of breath.  Denies abdominal pain.  No dysuria or frequency.  Denies cardiac or lung disease.   Cough Diarrhea Associated symptoms: vomiting   Emesis Associated symptoms: cough and diarrhea        Prior to Admission medications  Medication Sig Start Date End Date Taking? Authorizing Provider  albuterol  (VENTOLIN  HFA) 108 (90 Base) MCG/ACT inhaler Inhale 2 puffs into the lungs every 4 (four) hours as needed for wheezing or shortness of breath. 04/09/24  Yes Neysa Caron PARAS, DO  Biotin 10 MG CAPS Take 1 capsule by mouth daily.    [provider]  Cholecalciferol 25 MCG (1000 UT) tablet Take by mouth.    [provider]  cyclobenzaprine (FLEXERIL) 10 MG tablet Take by mouth.    [provider]  HYDROcodone-acetaminophen (NORCO/VICODIN) 5-325 MG tablet Take by mouth. 08/28/16   [provider]  LORazepam (ATIVAN) 2 MG tablet Take 2 mg by mouth 3 (three) times daily as needed. 11/03/20   [provider]  magnesium 30 MG tablet Take 30 mg by mouth 2 (two) times daily.    [provider]  omeprazole (PRILOSEC) 40 MG capsule omeprazole 40 mg capsule,delayed release    [provider]  Potassium (POTASSIMIN PO) Take by mouth.    [provider]  promethazine (PHENERGAN) 25 MG tablet Take by mouth.    [provider]  tobramycin (TOBREX) 0.3 % ophthalmic solution  05/24/10   [provider]  vitamin E 1000 UNIT capsule Take by mouth.     [provider]  Zinc 22.5 MG TABS Take 1 tablet by mouth daily.    [provider]    Allergies: Codeine and Opium    Review of Systems  Respiratory:  Positive for cough.   Gastrointestinal:  Positive for diarrhea and vomiting.    Updated Vital Signs BP (!) 135/53   Pulse (!) 104   Temp 98.3 F (36.8 C)   Resp 17   Ht 5' 5 (1.651 m)   Wt 59.9 kg   SpO2 96%   BMI 21.97 kg/m   Physical Exam Vitals and nursing note reviewed.  Constitutional:      General: She is not in acute distress. HENT:     Head: Normocephalic.     Mouth/Throat:     Mouth: Mucous membranes are dry.  Eyes:     Conjunctiva/sclera: Conjunctivae normal.  Cardiovascular:     Rate and Rhythm: Regular rhythm. Tachycardia present.     Pulses: Normal pulses.  Pulmonary:     Effort: No respiratory distress.     Breath sounds: Wheezing present.     Comments: Tachypnea noted.  Speaking in short sentences Abdominal:     General: Abdomen is flat. There is no distension.     Tenderness: There is no abdominal tenderness. There is no guarding or rebound.  Musculoskeletal:     Cervical back: Normal range of motion.     Right lower leg: No edema.  Left lower leg: No edema.  Skin:    General: Skin is warm.     Capillary Refill: Capillary refill takes less than 2 seconds.  Neurological:     Mental Status: She is alert.  Psychiatric:        Behavior: Behavior normal.     (all labs ordered are listed, but only abnormal results are displayed) Labs Reviewed  COMPREHENSIVE METABOLIC PANEL WITH GFR - Abnormal; Notable for the following components:      Result Value   Glucose, Bld 113 (*)    BUN 31 (*)    Creatinine, Ser 1.30 (*)    GFR, Estimated 43 (*)    Anion gap 17 (*)    All other components within normal limits  RESP PANEL BY RT-PCR (RSV, FLU A&B, COVID)  [id number]  CBC  URINALYSIS, ROUTINE W REFLEX MICROSCOPIC    EKG: None  Radiology: DG Chest 2 View Result Date:  04/09/2024 EXAM: 2 VIEW(S) XRAY OF THE CHEST 04/09/2024 09:37:13 PM COMPARISON: 09/09/2023 CLINICAL HISTORY: cough cough cough cough cough FINDINGS: LUNGS AND PLEURA: No focal pulmonary opacity. No pleural effusion. No pneumothorax. HEART AND MEDIASTINUM: No acute abnormality of the cardiac and mediastinal silhouettes. BONES AND SOFT TISSUES: Degenerative changes of thoracic spine. IMPRESSION: 1. No acute findings. Electronically signed by: Oneil Devonshire MD 04/09/2024 09:42 PM EST RP Workstation: [id number]     Procedures   Medications Ordered in the ED  ipratropium-albuterol  (DUONEB) 0.5-2.5 (3) MG/3ML nebulizer solution 3 mL (has no administration in time range)  ipratropium-albuterol  (DUONEB) 0.5-2.5 (3) MG/3ML nebulizer solution 3 mL (3 mLs Nebulization Given 04/09/24 2211)  albuterol  (PROVENTIL ) (2.5 MG/3ML) 0.083% nebulizer solution 2.5 mg (2.5 mg Nebulization Given 04/09/24 2211)  sodium chloride  0.9 % bolus 1,000 mL (1,000 mLs Intravenous New Bag/Given 04/09/24 2221)  ondansetron  (ZOFRAN ) injection 4 mg (4 mg Intravenous Given 04/09/24 2211)  iohexol  (OMNIPAQUE ) 350 MG/ML injection 60 mL (60 mLs Intravenous Contrast Given 04/09/24 2319)    Clinical Course as of 04/09/24 2355  Thu Apr 09, 2024  2354 CT Angio Chest PE W and/or Wo Contrast MPRESSION: 1. No evidence of pulmonary embolus. 2. Bilateral bronchial wall thickening consistent with bronchitis. 3.  Aortic Atherosclerosis (ICD10-I70.0).   Electronically Signed   By: Ozell Daring M.D.   On: 04/09/2024 23:50   [TY]    Clinical Course User Index [TY] Neysa Caron PARAS, DO                                 Medical Decision Making This is a 74 year old female presenting emergency department for viral syndrome type complaints for the past 4 to 5 days.  Hypertensive, no fever, elevated heart rate on arrival.  Had some borderline oxygen saturation around 90% or so, but improved after breathing treatment. Did have some wheezing and some  tachypnea, but not in respiratory distress. Improved after Neb. Overall clinical picture with likely viral etiology. However,  Flu/COVID/RSV negative.  No leukocytosis to suggest infectious process.  No anemia.  Does have minor elevation in BUN/creatinine which is c/w her dehydration from n/v/d.  Receiving IV fluids.  No other electrolyte abnormalities.  Chest x-ray without overt pneumonia.,  Given patient's borderline oxygen saturation and elevated heart rate CT scan ordered to evaluate for possible occult pneumonia versus PE.  Care signed out to overnight team; dispo pending CTA scan.   Amount and/or Complexity of Data Reviewed Independent Historian:  Details: History of hyperlipidemia Barrett's esophagus, CAD, dyspnea on exertion External Data Reviewed:     Details: No history of DVT/PE.  Not on blood thinners Labs: ordered. Decision-making details documented in ED Course.    Details: See above Radiology: ordered and independent interpretation performed.    Details: Chest x-ray without pneumonia pneumothorax ECG/medicine tests: ordered and independent interpretation performed.  Risk Prescription drug management. Decision regarding hospitalization. Diagnosis or treatment significantly limited by social determinants of health.       Final diagnoses:  Bronchitis    ED Discharge Orders          Ordered    albuterol  (VENTOLIN  HFA) 108 (90 Base) MCG/ACT inhaler  Every 4 hours PRN        04/09/24 2323               Neysa Caron PARAS, DO 04/09/24 2349  "

## 2024-04-09 NOTE — Discharge Instructions (Addendum)
 Try maintain adequate hydration.  Take nausea medication as needed.  Use the albuterol  inhaler as needed for shortness of breath.  Please follow-up with your primary doctor.  Return for new or worsening symptoms that are concerning to you.

## 2024-04-09 NOTE — ED Notes (Signed)
 Per family the Pt. Was sick last week end and She has cont to get worse with her Resp. Status.

## 2024-04-09 NOTE — ED Notes (Signed)
 Pt. Reports she has had diarrhea at home today too many to count probably 3 each hour.

## 2024-04-09 NOTE — ED Notes (Signed)
 EDP Young at bedside.  Resp. Marcey has been at bedside.  Pt. Is on cardiac monitor.  Pt. Is wheezing bilat.  Pt. Is ST to SR on monitor.  Pt. Has no edema

## 2024-04-09 NOTE — ED Notes (Signed)
 Educated patient on proper procedure for obtaining a clean catch urine specimen. Patient verbalizes understanding. Patient aware of necessity of urine sample.

## 2024-04-09 NOTE — ED Triage Notes (Signed)
 Patient coming to ED for evaluation of cough, diarrhea, and emesis.  States symptoms started on Saturday.  Did have a fever this morning.  Recently checked for Flu/COVID and had negative results.

## 2024-04-10 NOTE — ED Provider Notes (Signed)
 At sign out plan was to give IV fluids and to discharge when heart rate is appropriate.  HR continues to be in > 100 and O2 saturation in low 90s and below 90 with ambulation.    2:00 AM Patient continues to be hypoxic and tachycardiac with sats into the 80s with ambulation.  Patient does require admission for this  I have spoken to the patient and family at length about this as she does not have nebulizers nor oxygen at home.  Patient stated she was brought in by family for her breathing and diarrhea.  She states she will not be admitted.  I politely explained that given she does not have equipment at home to continue care and vitals have not normalized that it is imperative that she be admitted.  She declines. Patient was going to speak to other family members regarding this.    Patient is AO4, has decision making capacity to refuse care and has decided to leave against medical advice.  She is welcome to return at any time    [facility], Isahia Hollerbach, MD 04/10/24 9770
